# Patient Record
Sex: Female | Born: 1970 | Race: White | Hispanic: No | Marital: Single | Smoking: Current every day smoker
Health system: Southern US, Community
[De-identification: ages and names within clinical notes are randomized; demographics above are authoritative.]

## PROBLEM LIST (undated history)

## (undated) DIAGNOSIS — F419 Anxiety disorder, unspecified: Secondary | ICD-10-CM

## (undated) DIAGNOSIS — C539 Malignant neoplasm of cervix uteri, unspecified: Secondary | ICD-10-CM

## (undated) HISTORY — PX: TONSILLECTOMY: SUR1361

## (undated) HISTORY — PX: CERVICAL CONE BIOPSY: SUR198

---

## 2006-09-14 ENCOUNTER — Ambulatory Visit: Payer: Self-pay | Admitting: Emergency Medicine

## 2010-08-30 ENCOUNTER — Emergency Department: Payer: Self-pay | Admitting: Emergency Medicine

## 2011-01-18 ENCOUNTER — Ambulatory Visit: Payer: Self-pay | Admitting: Urology

## 2011-02-01 ENCOUNTER — Ambulatory Visit: Payer: Self-pay | Admitting: Urology

## 2011-07-26 ENCOUNTER — Emergency Department (HOSPITAL_COMMUNITY)
Admission: EM | Admit: 2011-07-26 | Discharge: 2011-07-26 | Disposition: A | Discharge status: Home / Self Care | Payer: Self-pay | Attending: Emergency Medicine | Admitting: Emergency Medicine

## 2011-07-26 ENCOUNTER — Encounter (HOSPITAL_COMMUNITY): Payer: Self-pay | Admitting: Emergency Medicine

## 2011-07-26 DIAGNOSIS — Z5189 Encounter for other specified aftercare: Secondary | ICD-10-CM

## 2011-07-26 DIAGNOSIS — Z4801 Encounter for change or removal of surgical wound dressing: Secondary | ICD-10-CM | POA: Insufficient documentation

## 2011-07-26 DIAGNOSIS — F17200 Nicotine dependence, unspecified, uncomplicated: Secondary | ICD-10-CM | POA: Insufficient documentation

## 2011-07-26 DIAGNOSIS — Z8541 Personal history of malignant neoplasm of cervix uteri: Secondary | ICD-10-CM | POA: Insufficient documentation

## 2011-07-26 DIAGNOSIS — F411 Generalized anxiety disorder: Secondary | ICD-10-CM | POA: Insufficient documentation

## 2011-07-26 HISTORY — DX: Anxiety disorder, unspecified: F41.9

## 2011-07-26 HISTORY — DX: Malignant neoplasm of cervix uteri, unspecified: C53.9

## 2011-07-26 MED ORDER — HYDROCODONE-ACETAMINOPHEN 5-325 MG PO TABS: 1.0000 | ORAL_TABLET | Freq: Four times a day (QID) | ORAL | Status: AC | PRN

## 2011-07-26 NOTE — Discharge Instructions (Signed)
Currently abscess appears to be feeling well particularly since it's only been 48 hours. Some of the skin redness is a little worrisome but do not think of cellulitis at this time we'll have to keep a 9 on a. Do to your extensive antibiotic allergies no good antibiotic to give you at this time. With large wound soak wound daily in warm water. And redress return for any new or worse symptoms are either followup with [facility] where they did the original I&D. Would not expect any development of fevers worse reddening or increased pain in the area.

## 2011-07-26 NOTE — ED Provider Notes (Signed)
History     CSN: [id number]  Arrival date & time 07/26/11  1716   First MD Initiated Contact with Patient 07/26/11 1938      Chief Complaint  Patient presents with  . Abscess    (Consider location/radiation/quality/duration/timing/severity/associated sxs/prior treatment) Patient is a 41 y.o. female presenting with abscess. The history is provided by the patient.  Abscess  Pertinent negatives include no fever, no congestion and no sore throat.   patient status post I&D of right buttocks wound did not perianal not perirectal in nature. Up along the hip 2 days ago by [facility]. The wound was dressed and packed packing fell out yesterday. Patient states she was given doxycycline antibiotic take but is allergic to that so did not take it. Patient is here today because she's concerned that the infection is not improving. No systemic symptoms no fever no headache no nausea no vomiting no abdominal pain. Still has some pain in the area the patient rates 5/10. States that has no pain medicine.  Past Medical History  Diagnosis Date  . Anxiety   . Cervical cancer     when 19    Past Surgical History  Procedure Date  . Cervical cone biopsy   . Tonsillectomy     History reviewed. No pertinent family history.  History  Substance Use Topics  . Smoking status: Current Everyday Smoker -- 0.5 packs/day  . Smokeless tobacco: Not on file  . Alcohol Use: Yes     occasionally    OB History    Grav Para Term Preterm Abortions TAB SAB Ect Mult Living                  Review of Systems  Constitutional: Negative for fever and chills.  HENT: Negative for congestion, sore throat and neck pain.   Eyes: Negative for redness.  Respiratory: Negative for shortness of breath.   Cardiovascular: Negative for chest pain.  Gastrointestinal: Negative for abdominal pain.  Genitourinary: Negative for dysuria.  Musculoskeletal: Negative for back pain.  Neurological: Negative for headaches.    Hematological: Does not bruise/bleed easily.    Allergies  Contrast media; Doxycycline; Erythromycin; Penicillins; Phenergan; and Sulfa antibiotics  Home Medications   Current Outpatient Rx  Name Route Sig Dispense Refill  . ALPRAZOLAM 1 MG PO TABS Oral Take 1 mg by mouth 3 (three) times daily.    . AMPHETAMINE-DEXTROAMPHET ER 20 MG PO CP24 Oral Take 20 mg by mouth at bedtime as needed. For sleep. Only takes when working 3rd shift.    Marland Kitchen CETIRIZINE HCL 10 MG PO TABS Oral Take 10 mg by mouth daily.    Marland Kitchen CLINDAMYCIN HCL 150 MG PO CAPS Oral Take 150 mg by mouth once.    Marland Kitchen FLUTICASONE PROPIONATE 50 MCG/ACT NA SUSP Nasal Place 1 spray into the nose daily.    Marland Kitchen HYDROCODONE-ACETAMINOPHEN 5-325 MG PO TABS Oral Take 1-2 tablets by mouth every 6 (six) hours as needed for pain. 10 tablet 0    BP 126/86  Pulse 120  Temp(Src) 98.5 F (36.9 C) (Oral)  Resp 16  SpO2 99%  LMP 07/13/2011  Physical Exam  Nursing note and vitals reviewed. Constitutional: She is oriented to person, place, and time. She appears well-developed and well-nourished.  HENT:  Head: Normocephalic and atraumatic.  Eyes: Conjunctivae and EOM are normal. Pupils are equal, round, and reactive to light.  Neck: Normal range of motion. Neck supple.  Cardiovascular: Normal rate, regular rhythm and normal heart  sounds.   Pulmonary/Chest: Effort normal and breath sounds normal.  Abdominal: Soft. Bowel sounds are normal. There is no tenderness.  Musculoskeletal: Normal range of motion. She exhibits no edema.       Extremities normal except for right buttocks area with previous I&D site with opening about 2-3 cm in size. No additional purulent drainage. Induration still present around the wound and the erythema around the I&D site is improved. Some faint erythema to the surrounding skin and down the thigh not streaky in nature do not feel it is a cellulitis but will need to be watched carefully. Distally neurovascular is intact. I&D  site without further fluctuance no significant purulent drainage appears to be healing appropriately.  Neurological: She is alert and oriented to person, place, and time. No cranial nerve deficit. She exhibits normal muscle tone. Coordination normal.  Skin: There is erythema.    ED Course  Procedures (including critical care time)  Labs Reviewed - No data to display No results found.   1. Wound check, abscess       MDM   Patient status post I&D of abscess on left hip buttocks area at [facility] on Tuesday morning. I would be 2 days ago. Patient states that she was given doxycycline antibiotic to take but she is allergic to that cause closing of her throat patient said she did not get it filled. Pharmacy bowel makes note of Cleocin clindamycin being given but patient states not so. Either way today the wound is doing well good I&D site no further fluctuance improving erythema around the I&D site. Some induration still present no excessive purulent drainage. No evidence of deep buttocks or perirectal-type infection at this time. Some skin erythema noted on not exactly intense enough to be consistent with a cellulitis patient is fairly fair skinned. Dressing reapplied patient recommended to soak the wound in warm tub water for 20 minutes each day. The original dressing is put on by [facility] came out yesterday along with the packing. No additional packing is required at this time the wound opening is quite large measuring about 2-3 cm in size.  In summary for at this point the I&D site is healing well and as expected.        Shelda Jakes, MD 07/26/11 [phone]

## 2011-07-26 NOTE — ED Notes (Signed)
Per pt, pt got bit by something thinks was spider on Thursday on L and R buttocks, pt originally thought it was tick bite, and tried tweezers to remove, then started to have pus, went to [facility] 3 days ago and had R buttocks drained and packed- was told it was an abscess; R buttocks abscess is dressed and pt unable to take of dressing; redness noted around dressing, and firm around dressing; pt having fevers/chills/ nausea a/w abscess

## 2011-12-16 IMAGING — CT CT ABD-PELV W/O CM
1 of 2 series · 15 of 32 positions shown, 19 images · non-contrast
Comparison: none

REASON FOR EXAM: flank pain hematuria
COMMENTS:

PROCEDURE:     KCT - KCT ABDOMEN/PELVIS WO  - February 01, 2011  [DATE]
RESULT:     Comparison: None
TECHNIQUE: Multiple axial images from the lung bases to the symphysis pubis
were obtained without oral and without intravenous contrast.

[Series 2: stone 3.0 i40f 3 · axial · 0.76mm/px · z∈[-260,+88]mm · 15 of 131 slices shown, 19 images]
[im 10/131  soft-tissue]
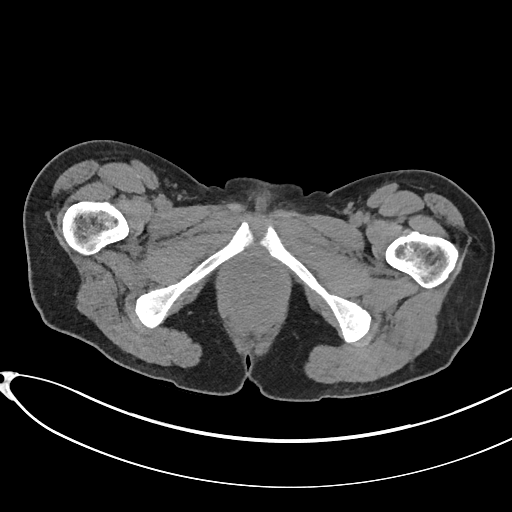
[im 10/131  bone]
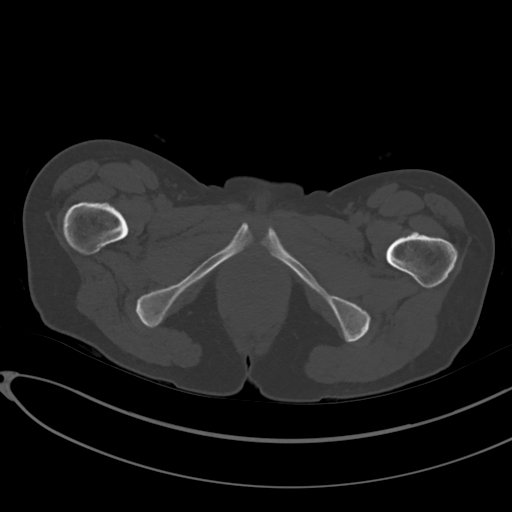
[im 20/131  soft-tissue]
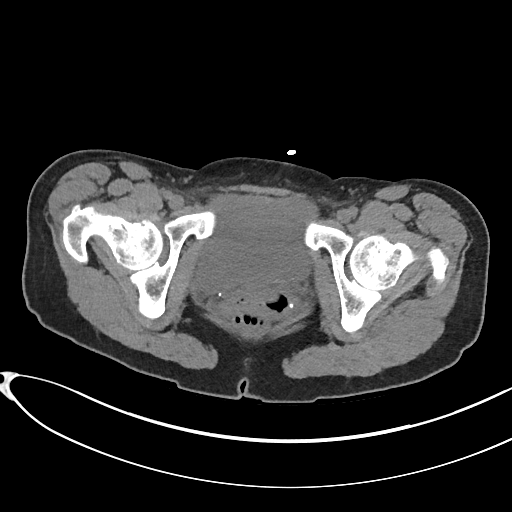
[im 29/131  soft-tissue]
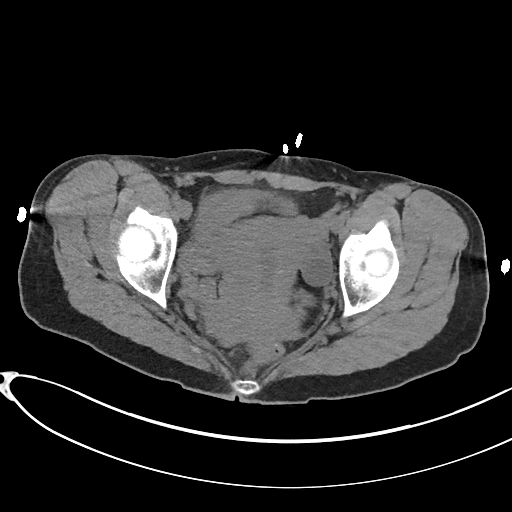
[im 39/131  soft-tissue]
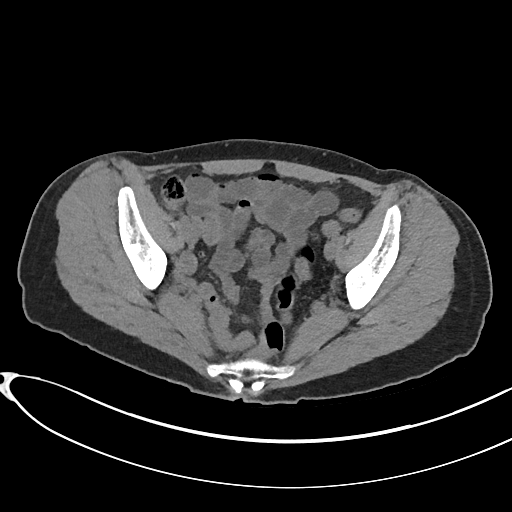
[im 49/131  soft-tissue]
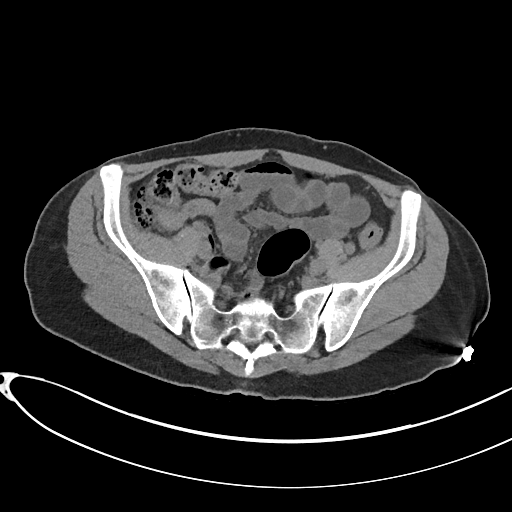
[im 58/131  soft-tissue]
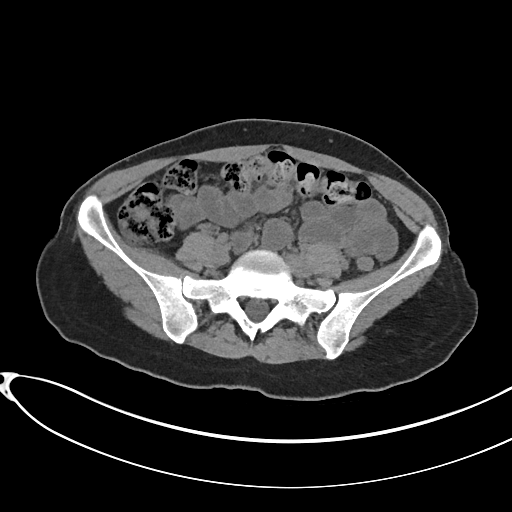
[im 68/131  soft-tissue]
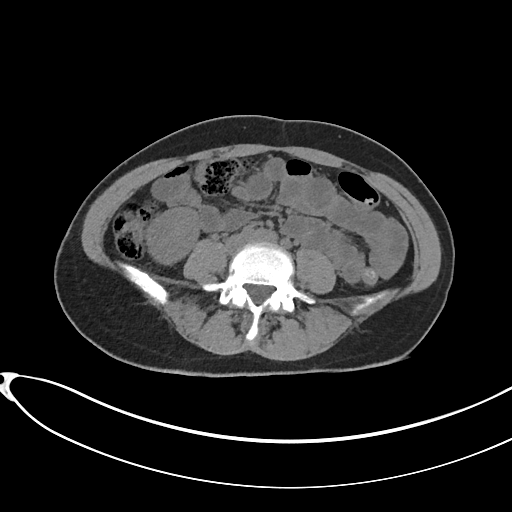
[im 78/131  soft-tissue]
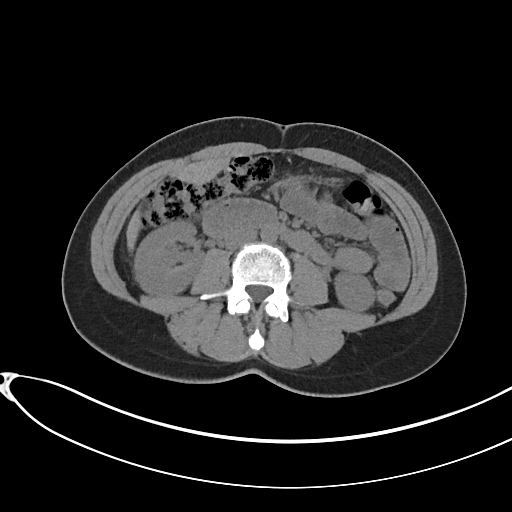
[im 87/131  soft-tissue]
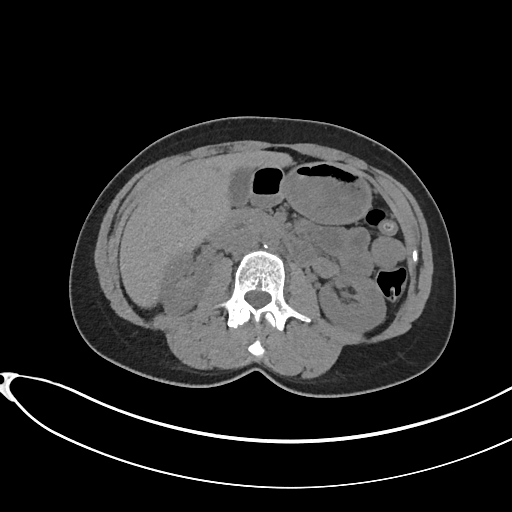
[im 87/131  bone]
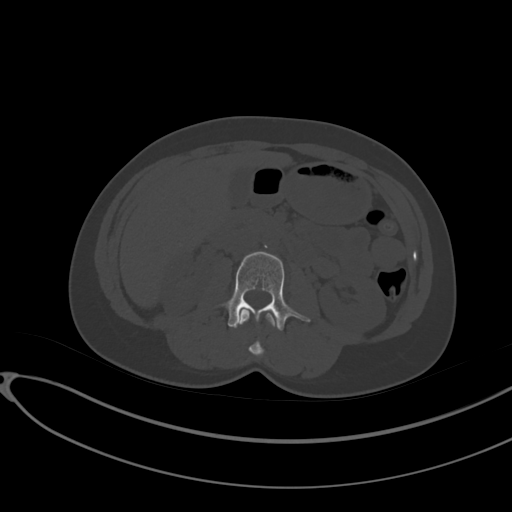
[im 97/131  soft-tissue]
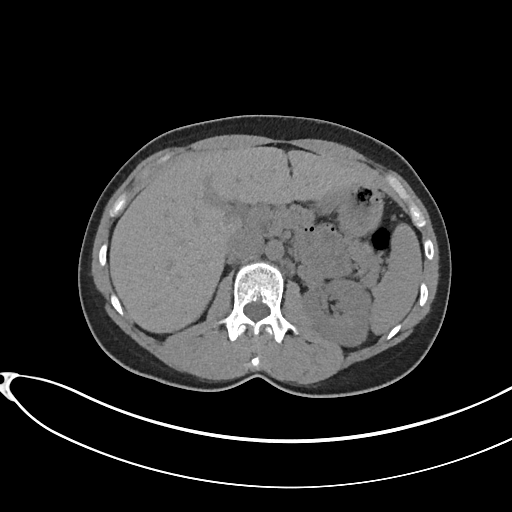
[im 106/131  soft-tissue]
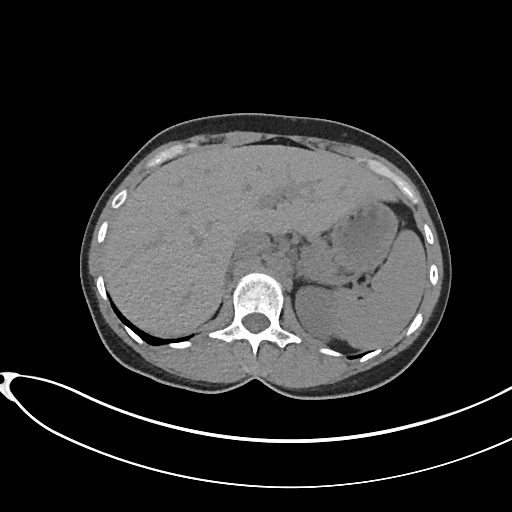
[im 111/131  lung]
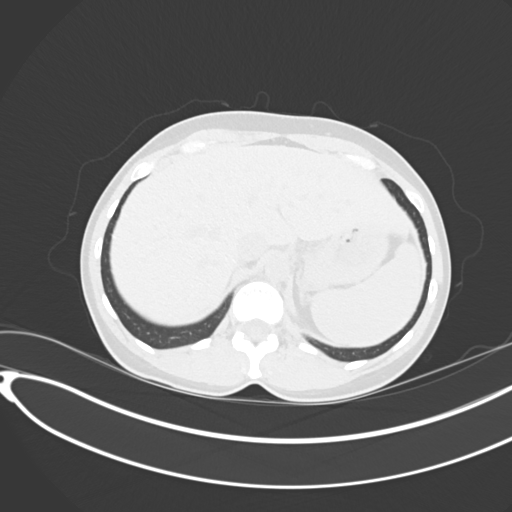
[im 116/131  soft-tissue]
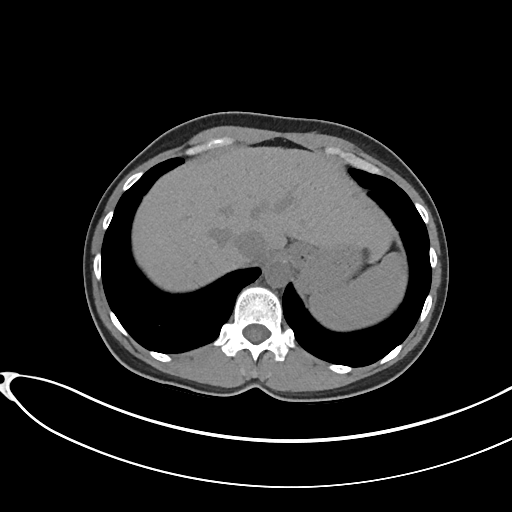
[im 116/131  lung]
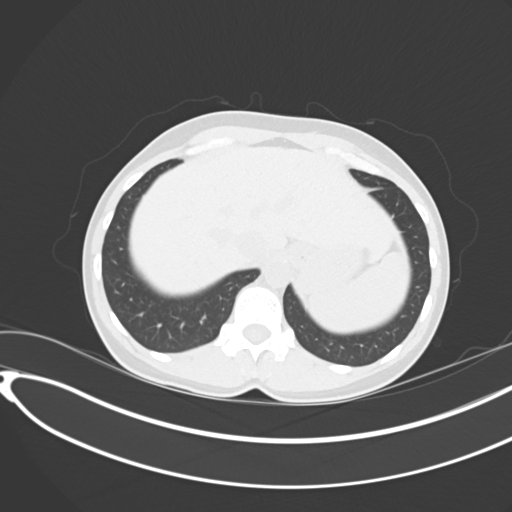
[im 121/131  lung]
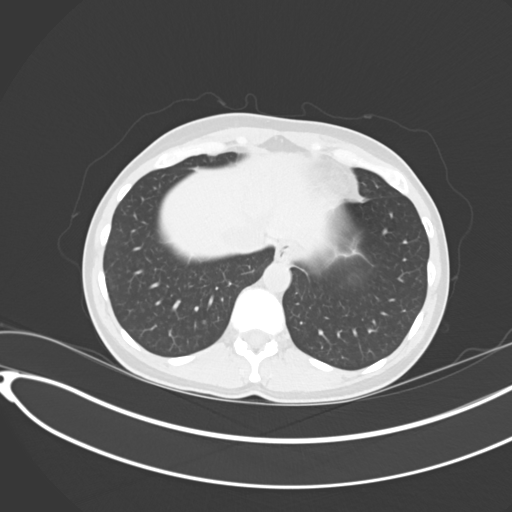
[im 126/131  soft-tissue]
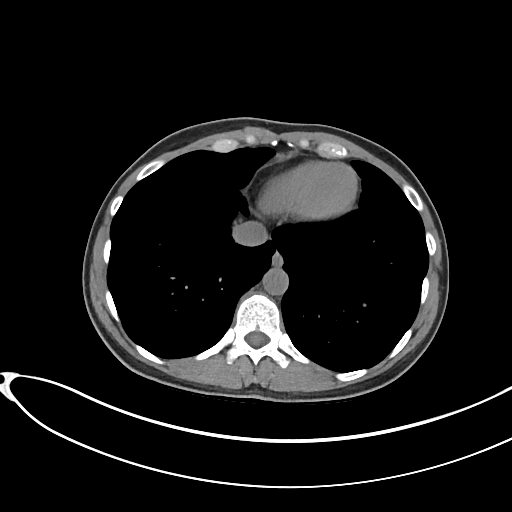
[im 126/131  lung]
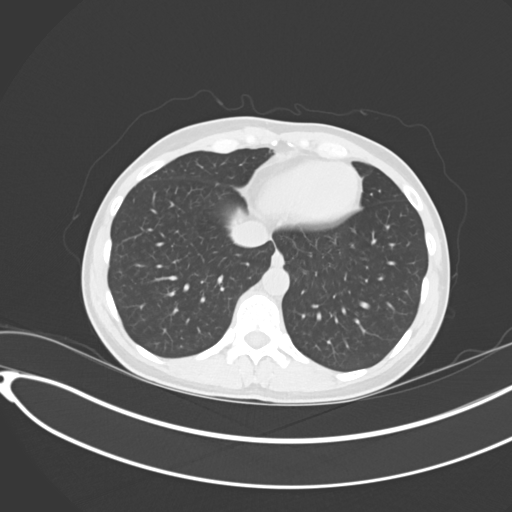

[15 of 32 positions shown; findings below may reference images not displayed]

FINDINGS: There is a 3 mm nodule in the right lower lobe, image 7.

Lack of intravenous contrast limits evaluation of the solid abdominal
organs.  Grossly, the liver, spleen, adrenals, gallbladder, and pancreas are
unremarkable. No renal calculi or hydronephrosis. No ureterectasis.

The small and large bowel are normal in caliber. The appendix is normal.
There are a few vascular calcifications in the abdominal aorta.

No aggressive lytic or sclerotic osseous lesions are identified. Round
sclerotic densities in the right acetabulum likely represents bone islands.
IMPRESSION: 1. No renal calculi or hydronephrosis.
2. Indeterminate 3 mm nodule in the right lower lobe. If the patient is at
low risk for lung cancer, no further follow-up is recommended.  If the
patient is at high risk for lung cancer, recommend 12 month follow-up
noncontrast chest CT.

## 2020-03-08 ENCOUNTER — Other Ambulatory Visit: Payer: Self-pay

## 2020-03-08 ENCOUNTER — Telehealth: Payer: Self-pay | Admitting: Emergency Medicine

## 2020-03-08 ENCOUNTER — Emergency Department
Admission: EM | Admit: 2020-03-08 | Discharge: 2020-03-08 | Disposition: A | Discharge status: Home / Self Care | Payer: Self-pay | Attending: Emergency Medicine | Admitting: Emergency Medicine

## 2020-03-08 DIAGNOSIS — R112 Nausea with vomiting, unspecified: Secondary | ICD-10-CM | POA: Insufficient documentation

## 2020-03-08 DIAGNOSIS — R1032 Left lower quadrant pain: Secondary | ICD-10-CM | POA: Insufficient documentation

## 2020-03-08 DIAGNOSIS — Z5321 Procedure and treatment not carried out due to patient leaving prior to being seen by health care provider: Secondary | ICD-10-CM | POA: Insufficient documentation

## 2020-03-08 LAB — COMPREHENSIVE METABOLIC PANEL
ALT: 21 U/L (ref 0–44)
AST: 27 U/L (ref 15–41)
Albumin: 4.5 g/dL (ref 3.5–5.0)
Alkaline Phosphatase: 66 U/L (ref 38–126)
Anion gap: 10 (ref 5–15)
BUN: 19 mg/dL (ref 6–20)
CO2: 25 mmol/L (ref 22–32)
Calcium: 9.8 mg/dL (ref 8.9–10.3)
Chloride: 102 mmol/L (ref 98–111)
Creatinine, Ser: 0.8 mg/dL (ref 0.44–1.00)
GFR, Estimated: >60 mL/min (ref >60)
Glucose, Bld: 127 mg/dL — ABNORMAL HIGH (ref 70–99)
Potassium: 4 mmol/L (ref 3.5–5.1)
Sodium: 137 mmol/L (ref 135–145)
Total Bilirubin: 0.7 mg/dL (ref 0.3–1.2)
Total Protein: 7.5 g/dL (ref 6.5–8.1)

## 2020-03-08 LAB — CBC
HCT: 41.2 % (ref 36.0–46.0)
Hemoglobin: 14.3 g/dL (ref 12.0–15.0)
MCH: 30.1 pg (ref 26.0–34.0)
MCHC: 34.7 g/dL (ref 30.0–36.0)
MCV: 86.7 fL (ref 80.0–100.0)
Platelets: 305 10*3/uL (ref 150–400)
RBC: 4.75 MIL/uL (ref 3.87–5.11)
RDW: 11.7 % (ref 11.5–15.5)
WBC: 19.2 10*3/uL — ABNORMAL HIGH (ref 4.0–10.5)
nRBC: 0 % (ref 0.0–0.2)

## 2020-03-08 LAB — LIPASE, BLOOD: Lipase: 30 U/L (ref 11–51)

## 2020-03-08 NOTE — ED Triage Notes (Signed)
Pt comes via EMS from home with c/o abdominal pain. Pt states this started this am. Pt states she thinks it is her appendix. Pt states vomiting.

## 2020-03-08 NOTE — Telephone Encounter (Signed)
Called patient due to left emergency department before provider exam to inquire about condition and follow up plans. Listed number is invalid.

## 2020-03-08 NOTE — Telephone Encounter (Signed)
Called [phone] which is listed as her mobile number on [facility] demographics, but this says it is a cleaning service number.

## 2020-03-08 NOTE — ED Triage Notes (Signed)
Pt comes into the ED via EMS from home with c/o LLQ pain with nausea since around 3am, tenderness with palpations.
